# Patient Record
Sex: Male | Born: 1990 | Race: White | Hispanic: No | Marital: Single | Smoking: Never smoker
Health system: Southern US, Community
[De-identification: ages and names within clinical notes are randomized; demographics above are authoritative.]

---
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
## 2011-07-04 LAB — CULTURE, ROUTINE-ABSCESS: Culture: NO GROWTH

## 2011-09-09 ENCOUNTER — Ambulatory Visit (INDEPENDENT_AMBULATORY_CARE_PROVIDER_SITE_OTHER): Payer: 59 | Admitting: Internal Medicine

## 2011-09-09 ENCOUNTER — Encounter: Payer: Self-pay | Admitting: Internal Medicine

## 2011-09-09 DIAGNOSIS — I861 Scrotal varices: Secondary | ICD-10-CM | POA: Insufficient documentation

## 2011-09-09 NOTE — Assessment & Plan Note (Signed)
Reviewed diagnosis It does not appear that preemptive surgery is appropriate unless marked symptoms Discussed possible infertility Will refer to urology if increased symptoms

## 2011-09-09 NOTE — Progress Notes (Signed)
  Subjective:    Patient ID: Isaac Rodriguez, male    DOB: 1991/03/19, 20 y.o.   MRN: [id number]  HPI Has noted some some "veins down behind left testicle" No pain occ gets sense of dull discomfort  Plays rugby No injuries  No current outpatient prescriptions on file prior to visit.    No Known Allergies  No past medical history on file.  No past surgical history on file.  No family history on file.  History   Social History  . Marital Status: Single    Spouse Name: N/A    Number of Children: N/A  . Years of Education: N/A   Occupational History  . Not on file.   Social History Main Topics  . Smoking status: Never Smoker   . Smokeless tobacco: Not on file  . Alcohol Use: Not on file  . Drug Use: Not on file  . Sexually Active: Not on file   Other Topics Concern  . Not on file   Social History Narrative   Studying biology at [employer]   Review of Systems Never had intercourse No urinary symptoms    Objective:   Physical Exam  Constitutional: He appears well-developed and well-nourished.  Genitourinary:       No hernia Irregular mass behind left testes Non tender Doesn't transilluminate          Assessment & Plan:

## 2011-09-09 NOTE — Patient Instructions (Signed)
Please call if you have more pain--we will set up evaluation with a urologist

## 2012-07-17 ENCOUNTER — Encounter (HOSPITAL_COMMUNITY): Payer: Self-pay | Admitting: Emergency Medicine

## 2012-07-17 ENCOUNTER — Emergency Department (HOSPITAL_COMMUNITY)
Admission: EM | Admit: 2012-07-17 | Discharge: 2012-07-17 | Disposition: A | Discharge status: Home / Self Care | Payer: 59 | Source: Home / Self Care | Attending: Emergency Medicine | Admitting: Emergency Medicine

## 2012-07-17 DIAGNOSIS — H16109 Unspecified superficial keratitis, unspecified eye: Secondary | ICD-10-CM

## 2012-07-17 DIAGNOSIS — H1130 Conjunctival hemorrhage, unspecified eye: Secondary | ICD-10-CM

## 2012-07-17 MED ORDER — TETRACAINE HCL 0.5 % OP SOLN
OPHTHALMIC | Status: AC
  Filled 2012-07-17: qty 2

## 2012-07-17 MED ORDER — TOBRAMYCIN 0.3 % OP OINT: TOPICAL_OINTMENT | Freq: Three times a day (TID) | OPHTHALMIC | Status: AC

## 2012-07-17 NOTE — ED Notes (Signed)
Pt c/o inj to left eye... Was playing rugby when the opponent from other team jammed finger into eye... Sx include: blurry vision, bleeding, irritation.... Pt denies: head inj/trauma, loss of conscious... Pt is alert w/no signs of distress.

## 2012-07-17 NOTE — ED Provider Notes (Signed)
History     CSN: [id number]  Arrival date & time 07/17/12  Barry Brunner   First MD Initiated Contact with Patient 07/17/12 1938      Chief Complaint  Patient presents with  . Eye Injury    (Consider location/radiation/quality/duration/timing/severity/associated sxs/prior treatment) HPI Comments: Patient presents to urgent care this evening after having sustained a left eye injury or playing rugby, an opponent player jammed his thumb into his left eye. Patient described discomfort redness and irritation of his left eye, exacerbates when he opens or moves his eye. Some blurriness is noted to his left eye. Denies any headache, double vision, carrying or changes in his pupils. He does wear contact lenses and is uncertain if his lenses he fell or dropped after the injury. He describes some irritation but not really painful more like a discomfort or irritation.  Patient is a 21 y.o. male presenting with eye injury.  Eye Injury This is a new problem. The current episode started 1 to 2 hours ago. The problem occurs constantly. The problem has not changed since onset.Pertinent negatives include no headaches.    History reviewed. No pertinent past medical history.  History reviewed. No pertinent past surgical history.  History reviewed. No pertinent family history.  History  Substance Use Topics  . Smoking status: Never Smoker   . Smokeless tobacco: Not on file  . Alcohol Use: No      Review of Systems  Constitutional: Negative for activity change and appetite change.  Eyes: Positive for pain, redness and visual disturbance. Negative for photophobia and discharge.  Neurological: Negative for dizziness and headaches.    Allergies  Review of patient's allergies indicates no known allergies.  Home Medications   Current Outpatient Rx  Name Route Sig Dispense Refill  . MINOCYCLINE HCL 100 MG PO CAPS Oral Take 100 mg by mouth 2 (two) times daily.      . TOBRAMYCIN SULFATE 0.3 % OP OINT  Left Eye Place into the left eye 3 (three) times daily. Apply 3 times a day x 5 days 3.5 g 0    BP 137/69  Pulse 95  Temp 99.2 F (37.3 C) (Oral)  Resp 16  SpO2 100%  Physical Exam  Nursing note and vitals reviewed. Constitutional: He is oriented to person, place, and time. He appears well-developed and well-nourished.  Eyes: EOM and lids are normal. Pupils are equal, round, and reactive to light. No foreign bodies found. No foreign body present in the right eye. No foreign body present in the left eye. Left conjunctiva has a hemorrhage.  Slit lamp exam:      The right eye shows no corneal abrasion, no corneal flare, no corneal ulcer, no foreign body and no hyphema.       The left eye shows fluorescein uptake. The left eye shows no corneal abrasion, no corneal flare, no corneal ulcer, no foreign body, no hyphema and no anterior chamber bulge.    Neurological: He is alert and oriented to person, place, and time.  Skin: No erythema.    ED Course  Procedures (including critical care time)  Labs Reviewed - No data to display No results found.   1. Subconjunctival hemorrhage, traumatic   2. Keratitis, superficial       MDM  Eye injury - trauma-subconjunctival hemorrhage and keratitis- no hyphema- started on tobramycin ointment encouraged to follow-up with pathobiologist if new symptoms or worsening pain. Normal extraocular movements and pupillary response to light and accomodation- Did not observed FB  or contact lenses. Encouraged to contact me this weekend if any questions or concerns      Jimmie Molly, MD 07/17/12 2033

## 2013-03-15 ENCOUNTER — Encounter: Payer: Self-pay | Admitting: Internal Medicine

## 2013-03-15 ENCOUNTER — Ambulatory Visit (INDEPENDENT_AMBULATORY_CARE_PROVIDER_SITE_OTHER): Payer: 59 | Admitting: Internal Medicine

## 2013-03-15 VITALS — BP 130/80 | HR 69 | Temp 98.5°F | Ht 71.0 in | Wt 192.0 lb

## 2013-03-15 DIAGNOSIS — Z0000 Encounter for general adult medical examination without abnormal findings: Secondary | ICD-10-CM

## 2013-03-15 DIAGNOSIS — I861 Scrotal varices: Secondary | ICD-10-CM

## 2013-03-15 NOTE — Assessment & Plan Note (Signed)
No symptoms Observation only

## 2013-03-15 NOTE — Assessment & Plan Note (Signed)
Healthy counselling done Nothing worrisome about the abdominal discomfort--seems to be from soda. Will hold off on any labs

## 2013-03-15 NOTE — Progress Notes (Signed)
Subjective:    Patient ID: Isaac Rodriguez, male    DOB: 06-Nov-1990, 22 y.o.   MRN: [id number]  HPI Here for physical Ready to start senior year at [employer] Plans to apply to medical school  No major health concerns Has occasional right lower abdominal cramping--dull pain Intermittent --?related to eating. Resolves but then may move his bowels after. Notes after drinking soda Appetite is fine No blood in stool Weight stable  Right groin pain at times No relationship to voiding or activity Not sexually active Intermittent and brief  No current outpatient prescriptions on file prior to visit.   No current facility-administered medications on file prior to visit.    No Known Allergies  No past medical history on file.  No past surgical history on file.  Family History  Problem Relation Age of Onset  . Diabetes Other   . Heart disease Other     History   Social History  . Marital Status: Single    Spouse Name: N/A    Number of Children: N/A  . Years of Education: N/A   Occupational History  . Not on file.   Social History Main Topics  . Smoking status: Never Smoker   . Smokeless tobacco: Never Used  . Alcohol Use: No  . Drug Use: No  . Sexually Active: Not on file   Other Topics Concern  . 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       Wears seat belt  HENT: Positive for congestion and rhinorrhea. Negative for hearing loss, dental problem and tinnitus.        Regular with dentist  Eyes: Negative for redness and visual disturbance.  Respiratory: Negative for cough, chest tightness and shortness of breath.   Cardiovascular: Negative for chest pain, palpitations and leg swelling.  Gastrointestinal: Positive for abdominal pain. Negative for nausea, vomiting, constipation and blood in stool.        No heartburn  Endocrine: Negative for cold intolerance and heat intolerance.  Genitourinary: Negative for frequency, difficulty urinating and testicular pain.  Musculoskeletal: Negative for back pain, joint swelling and arthralgias.  Skin: Negative for rash.       No suspicious lesions  Allergic/Immunologic: Positive for environmental allergies. Negative for immunocompromised state.       Cetirizine okay for seasonal allergies  Neurological: Negative for dizziness, syncope, weakness, light-headedness, numbness and headaches.  Hematological: Positive for adenopathy. Does not bruise/bleed easily.       ?left postauricular node---chronic  Psychiatric/Behavioral: Negative for sleep disturbance and dysphoric mood. The patient is not nervous/anxious.        Objective:   Physical Exam  Constitutional: He is oriented to person, place, and time. He appears well-developed and well-nourished. No distress.  HENT:  Head: Normocephalic and atraumatic.  Right Ear: External ear normal.  Left Ear: External ear normal.  Mouth/Throat: Oropharynx is clear and moist. No oropharyngeal exudate.  Eyes: Conjunctivae and EOM are normal. Pupils are equal, round, and reactive to light.  Neck: Normal range of motion. Neck supple. No thyromegaly present.  Cardiovascular: Normal rate, regular rhythm, normal heart sounds and intact distal pulses.  Exam reveals no gallop.   No murmur heard. Pulmonary/Chest: Effort normal and breath sounds normal. No respiratory distress. He has no wheezes. He has  no rales.  Abdominal: Soft. There is no tenderness.  No RLQ tenderness at all  Genitourinary:  Testes down and normal No hernia Varicocele on left still  Musculoskeletal: He exhibits no edema and no tenderness.  Lymphadenopathy:    He has no cervical adenopathy.  Neurological: He is alert and oriented to person, place, and time.  Skin: No rash noted. No erythema.  Psychiatric: He has a normal mood and affect.  His behavior is normal.          Assessment & Plan:

## 2014-01-26 ENCOUNTER — Telehealth: Payer: Self-pay

## 2014-01-26 NOTE — Telephone Encounter (Signed)
That is fine 

## 2014-01-26 NOTE — Telephone Encounter (Signed)
Called number listed and gentleman that answered stated neither was home. Will try again later

## 2014-01-26 NOTE — Telephone Encounter (Signed)
pts mother said pts [facility] and pt is repeating Hep b series; [facility]; [facility] for 2nd Hep B.Please advise.

## 2014-01-27 NOTE — Telephone Encounter (Signed)
Isaac Rodriguez called back and notified as instructed; nurse visit scheduled on 02/24/14 at 3:30 for 2nd Hep B vaccination.

## 2014-02-24 ENCOUNTER — Ambulatory Visit (INDEPENDENT_AMBULATORY_CARE_PROVIDER_SITE_OTHER): Payer: 59

## 2014-02-24 DIAGNOSIS — Z23 Encounter for immunization: Secondary | ICD-10-CM

## 2022-03-19 ENCOUNTER — Other Ambulatory Visit: Payer: Self-pay

## 2023-11-03 ENCOUNTER — Other Ambulatory Visit: Payer: Self-pay

## 2023-11-03 MED ORDER — XOFLUZA (80 MG DOSE) 1 X 80 MG PO TBPK
80.0000 mg | ORAL_TABLET | Freq: Once | ORAL | 0 refills | Status: AC
  Filled 2023-11-03: qty 1, 1d supply, fill #0

## 2023-11-04 ENCOUNTER — Other Ambulatory Visit: Payer: Self-pay

## 2023-11-05 ENCOUNTER — Other Ambulatory Visit: Payer: Self-pay

## 2023-11-07 ENCOUNTER — Other Ambulatory Visit: Payer: Self-pay

## 2023-11-07 MED ORDER — AMOXICILLIN-POT CLAVULANATE 875-125 MG PO TABS
1.0000 | ORAL_TABLET | Freq: Two times a day (BID) | ORAL | 0 refills | Status: AC
  Filled 2023-11-07: qty 10, 5d supply, fill #0
